# Patient Record
Sex: Female | Born: 1990 | Race: Black or African American | Hispanic: No | Smoking: Current every day smoker
Health system: Southern US, Community
[De-identification: ages and names within clinical notes are randomized; demographics above are authoritative.]

## PROBLEM LIST (undated history)

## (undated) DIAGNOSIS — Z789 Other specified health status: Secondary | ICD-10-CM

## (undated) HISTORY — PX: NO PAST SURGERIES: SHX2092

## (undated) HISTORY — DX: Other specified health status: Z78.9

## (undated) HISTORY — PX: DENTAL SURGERY: SHX609

## (undated) HISTORY — PX: WISDOM TOOTH EXTRACTION: SHX21

---

## 2012-06-04 ENCOUNTER — Emergency Department (HOSPITAL_COMMUNITY)
Admission: EM | Admit: 2012-06-04 | Discharge: 2012-06-05 | Disposition: A | Discharge status: Home / Self Care | Payer: Self-pay | Attending: Emergency Medicine | Admitting: Emergency Medicine

## 2012-06-04 ENCOUNTER — Encounter (HOSPITAL_COMMUNITY): Payer: Self-pay | Admitting: *Deleted

## 2012-06-04 DIAGNOSIS — J069 Acute upper respiratory infection, unspecified: Secondary | ICD-10-CM | POA: Insufficient documentation

## 2012-06-04 DIAGNOSIS — J029 Acute pharyngitis, unspecified: Secondary | ICD-10-CM | POA: Insufficient documentation

## 2012-06-04 MED ORDER — SODIUM CHLORIDE 0.9 % IV BOLUS (SEPSIS)
1000.0000 mL | Freq: Once | INTRAVENOUS | Status: AC
  Administered 2012-06-05: 1000 mL via INTRAVENOUS

## 2012-06-04 MED ORDER — ONDANSETRON HCL 4 MG/2ML IJ SOLN
4.0000 mg | Freq: Once | INTRAMUSCULAR | Status: AC
  Administered 2012-06-05: 4 mg via INTRAVENOUS
  Filled 2012-06-04: qty 2

## 2012-06-04 MED ORDER — ACETAMINOPHEN 325 MG PO TABS
650.0000 mg | ORAL_TABLET | Freq: Once | ORAL | Status: AC
  Administered 2012-06-04: 650 mg via ORAL
  Filled 2012-06-04: qty 2

## 2012-06-04 NOTE — ED Notes (Signed)
Pt c/o body aches and chills.  Mother states she recently had the flu and thinks she passed it to her.  Also c/o abdominal pain with n/v/d.

## 2012-06-05 LAB — POCT I-STAT, CHEM 8
Calcium, Ion: 1.14 mmol/L (ref 1.12–1.23)
Creatinine, Ser: 1 mg/dL (ref 0.50–1.10)
Glucose, Bld: 94 mg/dL (ref 70–99)
Hemoglobin: 14.3 g/dL (ref 12.0–15.0)
Sodium: 137 mEq/L (ref 135–145)
TCO2: 22 mmol/L (ref 0–100)

## 2012-06-05 LAB — URINALYSIS, ROUTINE W REFLEX MICROSCOPIC
Ketones, ur: 40 mg/dL — AB
Nitrite: NEGATIVE
Specific Gravity, Urine: 1.043 — ABNORMAL HIGH (ref 1.005–1.030)
Urobilinogen, UA: 1 mg/dL (ref 0.0–1.0)

## 2012-06-05 LAB — URINE MICROSCOPIC-ADD ON

## 2012-06-05 MED ORDER — IBUPROFEN 800 MG PO TABS: 800.0000 mg | ORAL_TABLET | Freq: Three times a day (TID) | ORAL | Status: AC

## 2012-06-05 MED ORDER — ACETAMINOPHEN-CODEINE 120-12 MG/5ML PO SUSP: 5.0000 mL | Freq: Four times a day (QID) | ORAL | Status: AC | PRN

## 2012-06-05 MED ORDER — PROMETHAZINE HCL 25 MG PO TABS: 25.0000 mg | ORAL_TABLET | Freq: Four times a day (QID) | ORAL | Status: DC | PRN

## 2012-06-05 NOTE — ED Notes (Signed)
Pt's family at bedside reports pt has been experiencing body aches, nausea and sore throat for the past few days - pt not speaking d/t pain in her throat. Pt in no acute distress, skin warm and dry. Pt w/ productive cough as well w/ yellow phlegm.

## 2012-06-05 NOTE — ED Notes (Signed)
Pt self-ambulated to restroom.

## 2012-06-05 NOTE — ED Notes (Signed)
Rx given x3 Pt ambulating independently w/ steady gait on d/c in no acute distress, A&Ox4. D/c instructions reviewed w/ pt and family - pt and family deny any further questions or concerns at present.  

## 2012-06-05 NOTE — ED Provider Notes (Signed)
History     CSN: [id number]  Arrival date & time 06/04/12  2119   First MD Initiated Contact with Patient 06/04/12 2316      Chief Complaint  Patient presents with  . URI  . Abdominal Pain    (Consider location/radiation/quality/duration/timing/severity/associated sxs/prior treatment) HPI HX per PT. Cough, congestion and sore throat last few days, tonight developed fever and emesis x 1, has had loose stool last few days, no blood in stools or emesis. Mild epigastric pain today prior to emesis, no pain since that time. Mother bedside had similar symptoms last week.  No HA, rash, tick exposure or neck stiffness.  History reviewed. No pertinent past medical history.  History reviewed. No pertinent past surgical history.  History reviewed. No pertinent family history.  History  Substance Use Topics  . Smoking status: Never Smoker   . Smokeless tobacco: Not on file  . Alcohol Use:     OB History    Grav Para Term Preterm Abortions TAB SAB Ect Mult Living                  Review of Systems  Constitutional: Positive for fever and chills.  HENT: Positive for sore throat. Negative for trouble swallowing, neck pain and neck stiffness.   Eyes: Negative for pain.  Respiratory: Positive for cough.   Cardiovascular: Negative for chest pain.  Gastrointestinal: Negative for blood in stool and abdominal distention.  Genitourinary: Negative for dysuria.  Musculoskeletal: Negative for back pain.  Skin: Negative for rash.  Neurological: Negative for syncope.  All other systems reviewed and are negative.    Allergies  Review of patient's allergies indicates no known allergies.  Home Medications  No current outpatient prescriptions on file.  BP 116/62  Pulse 104  Temp 100.2 F (37.9 C) (Oral)  Resp 16  SpO2 100%  Physical Exam  Constitutional: She is oriented to person, place, and time. She appears well-developed and well-nourished.  HENT:  Head: Normocephalic and  atraumatic.       enlarged erythematous tonsils, uvula midline. Nasal congestion  Eyes: Conjunctivae and EOM are normal. Pupils are equal, round, and reactive to light.  Neck: Trachea normal. Neck supple.  Cardiovascular: S1 normal, S2 normal and normal pulses.     No systolic murmur is present   No diastolic murmur is present  Pulses:      Radial pulses are 2+ on the right side, and 2+ on the left side.       Mild tachycardia  Pulmonary/Chest: Effort normal and breath sounds normal. She has no wheezes. She has no rhonchi. She has no rales. She exhibits no tenderness.  Abdominal: Soft. Normal appearance and bowel sounds are normal. There is no tenderness. There is no CVA tenderness and negative Murphy's sign.  Musculoskeletal:       BLE:s Calves nontender, no cords or erythema, negative Homans sign  Neurological: She is alert and oriented to person, place, and time. She has normal strength. No cranial nerve deficit or sensory deficit. GCS eye subscore is 4. GCS verbal subscore is 5. GCS motor subscore is 6.  Skin: Skin is warm and dry. No rash noted. She is not diaphoretic.  Psychiatric: Her speech is normal.       Cooperative and appropriate    ED Course  Procedures (including critical care time)  Results for orders placed during the hospital encounter of 06/04/12  RAPID STREP SCREEN      Component Value Range   Streptococcus,  Group A Screen (Direct) NEGATIVE  NEGATIVE  POCT I-STAT, CHEM 8      Component Value Range   Sodium 137  135 - 145 mEq/L   Potassium 3.7  3.5 - 5.1 mEq/L   Chloride 103  96 - 112 mEq/L   BUN 11  6 - 23 mg/dL   Creatinine, Ser 1.61  0.50 - 1.10 mg/dL   Glucose, Bld 94  70 - 99 mg/dL   Calcium, Ion 0.96  0.45 - 1.23 mmol/L   TCO2 22  0 - 100 mmol/L   Hemoglobin 14.3  12.0 - 15.0 g/dL   HCT 40.9  81.1 - 91.4 %  POCT PREGNANCY, URINE      Component Value Range   Preg Test, Ur NEGATIVE  NEGATIVE   IVFs, zofran and tylenol.   Clinical URI/ pharyngitis -  strep neg. Electrolytes WNL.   1:06 AM on recheck feeling better, no N/V in ED.    Plan d/c home with URI precautions verbalized as understood. Written precautions provided and referral provided for follow up.  MDM   VS and nursing notes reviewed - no ABD tenderness or peritonitis serial exams. Labs, UPT, rapid strep as above. Improved with tylenol and IVFs,        Sunnie Nielsen, MD 06/05/12 0110

## 2015-05-11 ENCOUNTER — Emergency Department (HOSPITAL_COMMUNITY): Payer: Self-pay

## 2015-05-11 ENCOUNTER — Encounter (HOSPITAL_COMMUNITY): Payer: Self-pay | Admitting: Emergency Medicine

## 2015-05-11 ENCOUNTER — Emergency Department (HOSPITAL_COMMUNITY)
Admission: EM | Admit: 2015-05-11 | Discharge: 2015-05-11 | Disposition: A | Discharge status: Home / Self Care | Payer: Self-pay | Attending: Emergency Medicine | Admitting: Emergency Medicine

## 2015-05-11 DIAGNOSIS — Z3202 Encounter for pregnancy test, result negative: Secondary | ICD-10-CM | POA: Insufficient documentation

## 2015-05-11 DIAGNOSIS — W231XXA Caught, crushed, jammed, or pinched between stationary objects, initial encounter: Secondary | ICD-10-CM | POA: Insufficient documentation

## 2015-05-11 DIAGNOSIS — S6722XA Crushing injury of left hand, initial encounter: Secondary | ICD-10-CM

## 2015-05-11 DIAGNOSIS — Y939 Activity, unspecified: Secondary | ICD-10-CM | POA: Insufficient documentation

## 2015-05-11 DIAGNOSIS — Z720 Tobacco use: Secondary | ICD-10-CM | POA: Insufficient documentation

## 2015-05-11 DIAGNOSIS — S60122A Contusion of left index finger with damage to nail, initial encounter: Secondary | ICD-10-CM | POA: Insufficient documentation

## 2015-05-11 DIAGNOSIS — Y929 Unspecified place or not applicable: Secondary | ICD-10-CM | POA: Insufficient documentation

## 2015-05-11 DIAGNOSIS — Y999 Unspecified external cause status: Secondary | ICD-10-CM | POA: Insufficient documentation

## 2015-05-11 LAB — POC URINE PREG, ED: PREG TEST UR: NEGATIVE

## 2015-05-11 MED ORDER — IBUPROFEN 600 MG PO TABS: 600.0000 mg | ORAL_TABLET | Freq: Four times a day (QID) | ORAL | Status: DC | PRN

## 2015-05-11 MED ORDER — IBUPROFEN 800 MG PO TABS: 800.0000 mg | ORAL_TABLET | Freq: Once | ORAL | Status: DC

## 2015-05-11 NOTE — ED Notes (Signed)
Pt stated that the door of her car closed onto the tip of her index finger on l/hand. Bleeding controlled. Bandaid in place

## 2015-05-11 NOTE — Discharge Instructions (Signed)
Keep elevated. Soak in warm soapy water. Keep clean. Ibuprofen for pain. Follow up with primary care doctor as needed.    Crush Injury, Fingers or Toes A crush injury to the fingers or toes means the tissues have been damaged by being squeezed (compressed). There will be bleeding into the tissues and swelling. Often, blood will collect under the skin. When this happens, the skin on the finger often dies and may slough off (shed) 1 week to 10 days later. Usually, new skin is growing underneath. If the injury has been too severe and the tissue does not survive, the damaged tissue may begin to turn black over several days.  Wounds which occur because of the crushing may be stitched (sutured) shut. However, crush injuries are more likely to become infected than other injuries.These wounds may not be closed as tightly as other types of cuts to prevent infection. Nails involved are often lost. These usually grow back over several weeks.  DIAGNOSIS X-rays may be taken to see if there is any injury to the bones. TREATMENT Broken bones (fractures) may be treated with splinting, depending on the fracture. Often, no treatment is required for fractures of the last bone in the fingers or toes. HOME CARE INSTRUCTIONS   The crushed part should be raised (elevated) above the heart or center of the chest as much as possible for the first several days or as directed. This helps with pain and lessens swelling. Less swelling increases the chances that the crushed part will survive.  Put ice on the injured area.  Put ice in a plastic bag.  Place a towel between your skin and the bag.  Leave the ice on for 15-20 minutes, 03-04 times a day for the first 2 days.  Only take over-the-counter or prescription medicines for pain, discomfort, or fever as directed by your caregiver.  Use your injured part only as directed.  Change your bandages (dressings) as directed.  Keep all follow-up appointments as directed by  your caregiver. Not keeping your appointment could result in a chronic or permanent injury, pain, and disability. If there is any problem keeping the appointment, you must call to reschedule. SEEK IMMEDIATE MEDICAL CARE IF:   There is redness, swelling, or increasing pain in the wound area.  Pus is coming from the wound.  You have a fever.  You notice a bad smell coming from the wound or dressing.  The edges of the wound do not stay together after the sutures have been removed.  You are unable to move the injured finger or toe. MAKE SURE YOU:   Understand these instructions.  Will watch your condition.  Will get help right away if you are not doing well or get worse. Document Released: 09/23/2005 Document Revised: 12/16/2011 Document Reviewed: 02/08/2011 [facility] Patient Information 2015 [place], [place]. This information is not intended to replace advice given to you by your health care provider. Make sure you discuss any questions you have with your health care provider.  Subungual Hematoma A subungual hematoma is a pocket of blood that collects under the fingernail or toenail. The pressure created by the blood under the nail can cause pain. CAUSES  A subungual hematoma occurs when an injury to the finger or toe causes a blood vessel beneath the nail to break. The injury can occur from a direct blow such as slamming a finger in a door. It can also occur from a repeated injury such as pressure on the foot in a shoe while  running. A subungual hematoma is sometimes called runner's toe or tennis toe. SYMPTOMS   Blue or dark blue skin under the nail.  Pain or throbbing in the injured area. DIAGNOSIS  Your caregiver can determine whether you have a subungual hematoma based on your history and a physical exam. If your caregiver thinks you might have a broken (fractured) bone, X-rays may be taken. TREATMENT  Hematomas usually go away on their own over time. Your caregiver may make a hole  in the nail to drain the blood. Draining the blood is painless and usually provides significant relief from pain and throbbing. The nail usually grows back normally after this procedure. In some cases, the nail may need to be removed. This is done if there is a cut under the nail that requires stitches (sutures). HOME CARE INSTRUCTIONS   Put ice on the injured area.  Put ice in a plastic bag.  Place a towel between your skin and the bag.  Leave the ice on for 15-20 minutes, 03-04 times a day for the first 1 to 2 days.  Elevate the injured area to help decrease pain and swelling.  If you were given a bandage, wear it for as long as directed by your caregiver.  If part of your nail falls off, trim the remaining nail gently. This prevents the nail from catching on something and causing further injury.  Only take over-the-counter or prescription medicines for pain, discomfort, or fever as directed by your caregiver. SEEK IMMEDIATE MEDICAL CARE IF:   You have redness or swelling around the nail.  You have yellowish-white fluid (pus) coming from the nail.  Your pain is not controlled with medicine.  You have a fever. MAKE SURE YOU:  Understand these instructions.  Will watch your condition.  Will get help right away if you are not doing well or get worse. Document Released: 09/20/2000 Document Revised: 12/16/2011 Document Reviewed: 09/11/2011 [facility] Patient Information 2015 [place], [place]. This information is not intended to replace advice given to you by your health care provider. Make sure you discuss any questions you have with your health care provider.

## 2015-05-11 NOTE — ED Provider Notes (Signed)
CSN: [id number]     Arrival date & time 05/11/15  1351 History  This chart was scribed for non-physician practitioner, Jaynie Crumble, PA-C, working with Mirian Mo, MD, by Budd Palmer ED Scribe. This patient was seen in room WTR7/WTR7 and the patient's care was started at 2:32 PM     Chief Complaint  Patient presents with  . Finger Injury    crush injury to tip of index finger on l/hand   The history is provided by the patient. No language interpreter was used.   HPI Comments: Valerie Velasquez is a 24 y.o. female who presents to the Emergency Department complaining of a painful injury to the tip of the left index finger sustained when the door of her car closed on it. She reports associated swelling, bruising, and bleeding and has applied a band-aid for the bleeding.  History reviewed. No pertinent past medical history. Past Surgical History  Procedure Laterality Date  . Dental surgery     History reviewed. No pertinent family history. History  Substance Use Topics  . Smoking status: Current Every Day Smoker    Types: Cigarettes  . Smokeless tobacco: Not on file  . Alcohol Use: Yes     Comment: occasional   OB History    No data available     Review of Systems  Musculoskeletal: Positive for myalgias.  Skin: Positive for color change and wound.    Allergies  Review of patient's allergies indicates no known allergies.  Home Medications   Prior to Admission medications   Medication Sig Start Date End Date Taking? Authorizing Provider  promethazine (PHENERGAN) 25 MG tablet Take 1 tablet (25 mg total) by mouth every 6 (six) hours as needed for nausea. 06/05/12 06/12/12  Sunnie Nielsen, MD   BP 122/86 mmHg  Pulse 70  Temp(Src) 98.7 F (37.1 C) (Oral)  Resp 18  Wt 217 lb (98.431 kg)  SpO2 99%  LMP 03/11/2015 (Approximate) Physical Exam  Constitutional: She is oriented to person, place, and time. She appears well-developed and well-nourished. No distress.  HENT:   Head: Normocephalic and atraumatic.  Mouth/Throat: Oropharynx is clear and moist.  Eyes: Conjunctivae and EOM are normal. Pupils are equal, round, and reactive to light.  Neck: Normal range of motion. Neck supple. No tracheal deviation present.  Cardiovascular: Normal rate.   Pulmonary/Chest: Breath sounds normal. No respiratory distress.  Abdominal: Soft.  Musculoskeletal: Normal range of motion.  Swelling to the distal right distal index finger. Subungual hematoma noted. No lacerations. ttp to the distal phalanx. Pain with rom at DIP joint. Cap refill <2sec  Neurological: She is alert and oriented to person, place, and time.  Skin: Skin is warm and dry.  Psychiatric: She has a normal mood and affect. Her behavior is normal.  Nursing note and vitals reviewed.   ED Course  Procedures  DIAGNOSTIC STUDIES: Oxygen Saturation is 99% on RA, normal by my interpretation.    COORDINATION OF CARE: 2:40 PM - Discussed preliminary normal XR results. Used a needle to drain the blood under the fingernail. Advised pt to wait for XR interpretation. Pt advised of plan for treatment and pt agrees.  Labs Review Labs Reviewed  POC URINE PREG, ED    Imaging Review Dg Finger Index Left  05/11/2015   CLINICAL DATA:  Crush injury to the tip of the index finger in car door. Initial encounter.  EXAM: LEFT INDEX FINGER 2+V  COMPARISON:  None.  FINDINGS: Soft tissue gas dorsal to the second  distal phalanx. No underlying fracture or dislocation.  IMPRESSION: Soft tissue gas without fracture.   Electronically Signed   By: Marnee Spring M.D.   On: 05/11/2015 14:32     EKG Interpretation None      MDM   Final diagnoses:  Crushing injury of finger of left hand, initial encounter  Subungual hematoma of second finger of left hand, initial encounter   Patient with crush injury to the distal right index finger. No lacerations. Subungual hematoma noted. Drained with 18-gauge needle. Pain improved. X-rays  negative. Instructed to keep elevated, soak, ibuprofen for pain, follow-up as needed.  Filed Vitals:   05/11/15 1401 05/11/15 1506  BP: 122/86   Pulse: 70 68  Temp: 98.7 F (37.1 C)   TempSrc: Oral   Resp: 18   Weight: 217 lb (98.431 kg)   SpO2: 99% 98%   I personally performed the services described in this documentation, which was scribed in my presence. The recorded information has been reviewed and is accurate.   Jaynie Crumble, PA-C 05/11/15 1642  Mirian Mo, MD 05/12/15 1046

## 2018-09-11 ENCOUNTER — Encounter (HOSPITAL_COMMUNITY): Payer: Self-pay

## 2018-09-11 ENCOUNTER — Emergency Department (HOSPITAL_COMMUNITY)
Admission: EM | Admit: 2018-09-11 | Discharge: 2018-09-11 | Disposition: A | Discharge status: Home / Self Care | Payer: Medicaid Other | Attending: Emergency Medicine | Admitting: Emergency Medicine

## 2018-09-11 ENCOUNTER — Other Ambulatory Visit: Payer: Self-pay

## 2018-09-11 DIAGNOSIS — Z3201 Encounter for pregnancy test, result positive: Secondary | ICD-10-CM | POA: Insufficient documentation

## 2018-09-11 DIAGNOSIS — F17210 Nicotine dependence, cigarettes, uncomplicated: Secondary | ICD-10-CM | POA: Insufficient documentation

## 2018-09-11 DIAGNOSIS — Z3490 Encounter for supervision of normal pregnancy, unspecified, unspecified trimester: Secondary | ICD-10-CM

## 2018-09-11 DIAGNOSIS — Z79899 Other long term (current) drug therapy: Secondary | ICD-10-CM | POA: Insufficient documentation

## 2018-09-11 DIAGNOSIS — M549 Dorsalgia, unspecified: Secondary | ICD-10-CM | POA: Insufficient documentation

## 2018-09-11 LAB — URINALYSIS, ROUTINE W REFLEX MICROSCOPIC
Bilirubin Urine: NEGATIVE
GLUCOSE, UA: NEGATIVE mg/dL
HGB URINE DIPSTICK: NEGATIVE
Ketones, ur: NEGATIVE mg/dL
Leukocytes, UA: NEGATIVE
Nitrite: NEGATIVE
Protein, ur: NEGATIVE mg/dL
SPECIFIC GRAVITY, URINE: 1.026 (ref 1.005–1.030)
pH: 7 (ref 5.0–8.0)

## 2018-09-11 LAB — POC URINE PREG, ED: Preg Test, Ur: POSITIVE — AB

## 2018-09-11 MED ORDER — PRENATAL COMPLETE 14-0.4 MG PO TABS: 1.0000 | ORAL_TABLET | Freq: Every day | ORAL | 0 refills | Status: AC

## 2018-09-11 NOTE — ED Triage Notes (Signed)
Pt states that she has been having lower back pain x2 weeks. Pt had a fall on her tailbone as well as few days ago. Pt has been taking ibuprofen without relief. Denies any bowel or bladder issues. Pt states back pain is worse on the left side. Pt is unsure of pregnancy status.

## 2018-09-11 NOTE — Discharge Instructions (Addendum)
Take Tylenol as needed for back pain.  Ice affected area.  Follow-up with OB/GYN as soon as possible for continued evaluation.  Take a daily prenatal vitamin.  Return to the ED immediately for new or worsening symptoms, such as abdominal pain, vaginal bleeding or any concerns at all.

## 2018-09-11 NOTE — ED Provider Notes (Signed)
[facility]-EMERGENCY DEPT Provider Note   CSN: [id number] Arrival date & time: 09/11/18  1542     History   Chief Complaint Chief Complaint  Patient presents with  . Back Pain    HPI Valerie Velasquez is a 27 y.o. female.  HPI 27 year old female presents with intermittent back pain 2 weeks.  She describes it as low back pain worse with certain movements.  She states the pain improves when she is up and walking around.  She denies any radiation of the pain down the legs or to the abdomen.  She denies any nausea, vomiting, abdominal pain, dysuria, urinary urgency, urinary frequency.  She denies any vaginal bleeding, vaginal discharge.  She states her last period was in October.  She has not taken a pregnancy test.   History reviewed. No pertinent past medical history.  There are no active problems to display for this patient.   Past Surgical History:  Procedure Laterality Date  . DENTAL SURGERY       OB History   None      Home Medications    Prior to Admission medications   Medication Sig Start Date End Date Taking? Authorizing Provider  ibuprofen (ADVIL,MOTRIN) 600 MG tablet Take 1 tablet (600 mg total) by mouth every 6 (six) hours as needed. 05/11/15   Kirichenko, Lemont Fillers, PA-C  promethazine (PHENERGAN) 25 MG tablet Take 1 tablet (25 mg total) by mouth every 6 (six) hours as needed for nausea. 06/05/12 06/12/12  Sunnie Nielsen, MD    Family History No family history on file.  Social History Social History   Tobacco Use  . Smoking status: Current Every Day Smoker    Packs/day: 0.50    Types: Cigarettes  Substance Use Topics  . Alcohol use: Yes    Comment: occasional  . Drug use: Yes    Types: Marijuana     Allergies   Patient has no known allergies.   Review of Systems Review of Systems  Constitutional: Negative for chills and fever.  Respiratory: Negative for shortness of breath.   Cardiovascular: Negative for chest pain.    Gastrointestinal: Negative for abdominal pain, nausea and vomiting.  Genitourinary: Negative for dysuria, urgency, vaginal bleeding and vaginal discharge.  Musculoskeletal: Positive for back pain (intermittent).     Physical Exam Updated Vital Signs BP 124/77 (BP Location: Right Arm)   Pulse 93   Temp 99.5 F (37.5 C) (Oral)   Resp 18   Ht 4\' 11"  (1.499 m)   Wt 122.5 kg   SpO2 98%   BMI 54.53 kg/m   Physical Exam  Constitutional: She is oriented to person, place, and time. She appears well-developed and well-nourished.  HENT:  Head: Normocephalic and atraumatic.  Eyes: Conjunctivae and EOM are normal.  Neck: Neck supple.  Cardiovascular: Normal rate, regular rhythm and normal heart sounds.  No murmur heard. Pulmonary/Chest: Effort normal and breath sounds normal. No respiratory distress. She has no wheezes. She has no rales.  Abdominal: Soft. Bowel sounds are normal. She exhibits no distension. There is no tenderness.  Musculoskeletal: Normal range of motion. She exhibits no tenderness or deformity.       Cervical back: Normal.       Thoracic back: Normal.       Lumbar back: Normal.  Neurological: She is alert and oriented to person, place, and time.  Skin: Skin is warm and dry. No rash noted. No erythema.  Psychiatric: She has a normal mood and affect. Her  behavior is normal.  Nursing note and vitals reviewed.    ED Treatments / Results  Labs (all labs ordered are listed, but only abnormal results are displayed) Labs Reviewed  POC URINE PREG, ED - Abnormal; Notable for the following components:      Result Value   Preg Test, Ur POSITIVE (*)    All other components within normal limits  URINALYSIS, ROUTINE W REFLEX MICROSCOPIC    EKG None  Radiology No results found.  Procedures Procedures (including critical care time)  Medications Ordered in ED Medications - No data to display   Initial Impression / Assessment and Plan / ED Course  I have reviewed  the triage vital signs and the nursing notes.  Pertinent labs & imaging results that were available during my care of the patient were reviewed by me and considered in my medical decision making (see chart for details).     Resting comfortably in bed, no acute distress, nontoxic, non-lethargic.  Signs stable.  Patient denies back pain currently.  Back is nontender to palpation.  Her urine pregnancy is positive.  Patient denies any abdominal pain, vaginal bleeding, vaginal discharge.  Urine shows no bacteria in the urine.  No indication for further work-up at this time.  Encouraged outpatient follow-up for ultrasound.  Given strict return precautions.   At this time there does not appear to be any evidence of an acute emergency medical condition and the patient appears stable for discharge with appropriate outpatient follow up.Diagnosis was discussed with patient who verbalizes understanding and is agreeable to discharge.   Final Clinical Impressions(s) / ED Diagnoses   Final diagnoses:  None    ED Discharge Orders    None       Rueben BashKendrick, Symone Cornman S, PA-C 09/11/18 2227    Loren RacerYelverton, David, MD 09/11/18 2334

## 2018-09-27 ENCOUNTER — Emergency Department (HOSPITAL_COMMUNITY)
Admission: EM | Admit: 2018-09-27 | Discharge: 2018-09-27 | Disposition: A | Discharge status: Home / Self Care | Payer: Medicaid Other | Attending: Emergency Medicine | Admitting: Emergency Medicine

## 2018-09-27 ENCOUNTER — Encounter (HOSPITAL_COMMUNITY): Payer: Self-pay | Admitting: Emergency Medicine

## 2018-09-27 DIAGNOSIS — Z3A08 8 weeks gestation of pregnancy: Secondary | ICD-10-CM | POA: Insufficient documentation

## 2018-09-27 DIAGNOSIS — O99331 Smoking (tobacco) complicating pregnancy, first trimester: Secondary | ICD-10-CM | POA: Diagnosis not present

## 2018-09-27 DIAGNOSIS — R110 Nausea: Secondary | ICD-10-CM | POA: Diagnosis not present

## 2018-09-27 DIAGNOSIS — Z3491 Encounter for supervision of normal pregnancy, unspecified, first trimester: Secondary | ICD-10-CM

## 2018-09-27 DIAGNOSIS — F17210 Nicotine dependence, cigarettes, uncomplicated: Secondary | ICD-10-CM | POA: Insufficient documentation

## 2018-09-27 DIAGNOSIS — O219 Vomiting of pregnancy, unspecified: Secondary | ICD-10-CM | POA: Insufficient documentation

## 2018-09-27 DIAGNOSIS — O210 Mild hyperemesis gravidarum: Secondary | ICD-10-CM | POA: Diagnosis not present

## 2018-09-27 LAB — BASIC METABOLIC PANEL
Anion gap: 10 (ref 5–15)
BUN: 8 mg/dL (ref 6–20)
CALCIUM: 8.8 mg/dL — AB (ref 8.9–10.3)
CO2: 24 mmol/L (ref 22–32)
Chloride: 101 mmol/L (ref 98–111)
Creatinine, Ser: 0.78 mg/dL (ref 0.44–1.00)
GFR calc Af Amer: >60 mL/min (ref >60)
GLUCOSE: 102 mg/dL — AB (ref 70–99)
Potassium: 4.3 mmol/L (ref 3.5–5.1)
Sodium: 135 mmol/L (ref 135–145)

## 2018-09-27 LAB — URINALYSIS, ROUTINE W REFLEX MICROSCOPIC
BACTERIA UA: NONE SEEN
BILIRUBIN URINE: NEGATIVE
Glucose, UA: NEGATIVE mg/dL
Hgb urine dipstick: NEGATIVE
Ketones, ur: 5 mg/dL — AB
Leukocytes, UA: NEGATIVE
Nitrite: NEGATIVE
PROTEIN: NEGATIVE mg/dL
SPECIFIC GRAVITY, URINE: 1.005 (ref 1.005–1.030)
pH: 8 (ref 5.0–8.0)

## 2018-09-27 LAB — CBC WITH DIFFERENTIAL/PLATELET
Abs Immature Granulocytes: 0.05 10*3/uL (ref 0.00–0.07)
BASOS PCT: 1 %
Basophils Absolute: 0.1 10*3/uL (ref 0.0–0.1)
EOS ABS: 0.1 10*3/uL (ref 0.0–0.5)
Eosinophils Relative: 1 %
HCT: 36 % (ref 36.0–46.0)
Hemoglobin: 11.9 g/dL — ABNORMAL LOW (ref 12.0–15.0)
IMMATURE GRANULOCYTES: 1 %
Lymphocytes Relative: 17 %
Lymphs Abs: 1.8 10*3/uL (ref 0.7–4.0)
MCH: 30 pg (ref 26.0–34.0)
MCHC: 33.1 g/dL (ref 30.0–36.0)
MCV: 90.7 fL (ref 80.0–100.0)
MONO ABS: 0.9 10*3/uL (ref 0.1–1.0)
Monocytes Relative: 9 %
NEUTROS PCT: 71 %
Neutro Abs: 7.8 10*3/uL — ABNORMAL HIGH (ref 1.7–7.7)
PLATELETS: 353 10*3/uL (ref 150–400)
RBC: 3.97 MIL/uL (ref 3.87–5.11)
RDW: 13.1 % (ref 11.5–15.5)
WBC: 10.8 10*3/uL — ABNORMAL HIGH (ref 4.0–10.5)
nRBC: 0 % (ref 0.0–0.2)

## 2018-09-27 LAB — I-STAT BETA HCG BLOOD, ED (MC, WL, AP ONLY): I-stat hCG, quantitative: >2000 m[IU]/mL — ABNORMAL HIGH (ref <5)

## 2018-09-27 MED ORDER — LACTATED RINGERS IV BOLUS
1000.0000 mL | Freq: Once | INTRAVENOUS | Status: AC
  Administered 2018-09-27: 1000 mL via INTRAVENOUS

## 2018-09-27 MED ORDER — LACTATED RINGERS IV BOLUS
1000.0000 mL | Freq: Once | INTRAVENOUS | Status: AC
Start: 2018-09-27 — End: 2018-09-27
  Administered 2018-09-27: 1000 mL via INTRAVENOUS

## 2018-09-27 MED ORDER — METOCLOPRAMIDE HCL 5 MG/ML IJ SOLN
10.0000 mg | Freq: Once | INTRAMUSCULAR | Status: AC
  Administered 2018-09-27: 10 mg via INTRAVENOUS
  Filled 2018-09-27: qty 2

## 2018-09-27 MED ORDER — DIPHENHYDRAMINE HCL 50 MG/ML IJ SOLN
25.0000 mg | Freq: Once | INTRAMUSCULAR | Status: AC
  Administered 2018-09-27: 25 mg via INTRAVENOUS
  Filled 2018-09-27: qty 1

## 2018-09-27 MED ORDER — DOXYLAMINE-PYRIDOXINE 10-10 MG PO TBEC: 1.0000 | DELAYED_RELEASE_TABLET | Freq: Three times a day (TID) | ORAL | 0 refills | Status: DC | PRN

## 2018-09-27 NOTE — ED Provider Notes (Signed)
[facility]-EMERGENCY DEPT Provider Note   CSN: [id number] Arrival date & time: 09/27/18  [id number]     History   Chief Complaint Chief Complaint  Patient presents with  . Emesis    HPI Valerie Velasquez is a 27 y.o. female.  HPI   27 yo G1P0 at estimated [redacted] wk GA here with n/v. Pt states that for the past 3 weeks, she's had progressively worsening nausea and vomiting. It has been worse in AM but persists throughout the day. Over the last week, she's had difficulty keeping food/drink down and subsequently presents for evaluation. She adamantly denies any abd pain, vaginal bleeding, leakage of fluid, or other complaints. No fever or chills. No urinary sx. She tried OTC nausea meds yesterday but vomited it up quickly. No h/o prior pregnancies. No h/o abdominal surgeries. Sx worse with eating, also in AM as mentioned. No alleviating factors.  History reviewed. No pertinent past medical history.  There are no active problems to display for this patient.   Past Surgical History:  Procedure Laterality Date  . DENTAL SURGERY       OB History    Gravida  1   Para      Term      Preterm      AB      Living        SAB      TAB      Ectopic      Multiple      Live Births               Home Medications    Prior to Admission medications   Medication Sig Start Date End Date Taking? Authorizing Provider  Prenatal Vit-Fe Fumarate-FA (PRENATAL COMPLETE) 14-0.4 MG TABS Take 1 tablet by mouth daily. 09/11/18  Yes Kendrick, Caitlyn S, PA-C  Doxylamine-Pyridoxine 10-10 MG TBEC Take 1 tablet by mouth 3 (three) times daily as needed (nausea, vomiting). 09/27/18   Shaune PollackIsaacs, Misbah Hornaday, MD    Family History No family history on file.  Social History Social History   Tobacco Use  . Smoking status: Current Every Day Smoker    Packs/day: 0.50    Types: Cigarettes  . Smokeless tobacco: Never Used  Substance Use Topics  . Alcohol use: Yes    Comment:  occasional  . Drug use: Yes    Types: Marijuana     Allergies   Patient has no known allergies.   Review of Systems Review of Systems  Constitutional: Positive for fatigue. Negative for chills and fever.  HENT: Negative for congestion, rhinorrhea and sore throat.   Eyes: Negative for visual disturbance.  Respiratory: Negative for cough, shortness of breath and wheezing.   Cardiovascular: Negative for chest pain and leg swelling.  Gastrointestinal: Positive for nausea and vomiting. Negative for abdominal pain and diarrhea.  Genitourinary: Negative for dysuria, flank pain, vaginal bleeding and vaginal discharge.  Musculoskeletal: Negative for neck pain.  Skin: Negative for rash.  Allergic/Immunologic: Negative for immunocompromised state.  Neurological: Negative for syncope and headaches.  Hematological: Does not bruise/bleed easily.  All other systems reviewed and are negative.    Physical Exam Updated Vital Signs BP 135/82 (BP Location: Right Arm)   Pulse 87   Temp 99.3 F (37.4 C) (Oral)   Resp 16   LMP 07/31/2018 (Approximate)   SpO2 100%   Physical Exam Vitals signs and nursing note reviewed.  Constitutional:      General: She is not in acute  distress.    Appearance: She is well-developed.  HENT:     Head: Normocephalic and atraumatic.     Mouth/Throat:     Mouth: Mucous membranes are dry.  Eyes:     Conjunctiva/sclera: Conjunctivae normal.  Neck:     Musculoskeletal: Neck supple.  Cardiovascular:     Rate and Rhythm: Normal rate and regular rhythm.     Heart sounds: Normal heart sounds. No murmur. No friction rub.  Pulmonary:     Effort: Pulmonary effort is normal. No respiratory distress.     Breath sounds: Normal breath sounds. No wheezing or rales.  Abdominal:     General: There is no distension.     Palpations: Abdomen is soft.     Tenderness: There is no abdominal tenderness.     Comments: Non-tender, no distension  Skin:    General: Skin is  warm.     Capillary Refill: Capillary refill takes less than 2 seconds.  Neurological:     Mental Status: She is alert and oriented to person, place, and time.     Motor: No abnormal muscle tone.      ED Treatments / Results  Labs (all labs ordered are listed, but only abnormal results are displayed) Labs Reviewed  CBC WITH DIFFERENTIAL/PLATELET - Abnormal; Notable for the following components:      Result Value   WBC 10.8 (*)    Hemoglobin 11.9 (*)    Neutro Abs 7.8 (*)    All other components within normal limits  BASIC METABOLIC PANEL - Abnormal; Notable for the following components:   Glucose, Bld 102 (*)    Calcium 8.8 (*)    All other components within normal limits  URINALYSIS, ROUTINE W REFLEX MICROSCOPIC - Abnormal; Notable for the following components:   Ketones, ur 5 (*)    All other components within normal limits  I-STAT BETA HCG BLOOD, ED ([facility], [facility], [facility] ONLY) - Abnormal; Notable for the following components:   I-stat hCG, quantitative >2,000.0 (*)    All other components within normal limits    EKG None  Radiology No results found.  Procedures Procedures (including critical care time)  Medications Ordered in ED Medications  lactated ringers bolus 1,000 mL (1,000 mLs Intravenous New Bag/Given 09/27/18 0914)  lactated ringers bolus 1,000 mL (0 mLs Intravenous Stopped 09/27/18 1013)  metoCLOPramide (REGLAN) injection 10 mg (10 mg Intravenous Given 09/27/18 0913)  diphenhydrAMINE (BENADRYL) injection 25 mg (25 mg Intravenous Given 09/27/18 0913)     Initial Impression / Assessment and Plan / ED Course  I have reviewed the triage vital signs and the nursing notes.  Pertinent labs & imaging results that were available during my care of the patient were reviewed by me and considered in my medical decision making (see chart for details).  Clinical Course as of Sep 28 1055  Sun Sep 27, 2018  0904 27 yo G1P0 here w/ nausea vomiting. Likely 2/2 hyperemesis  gravidarum. She is adamant she has no abdominal pain, VB, LOF, or sx to suggest threatened AB or ectopic. Will start fluids, check basic labs, and re-assess.   [CI]  0958 Mild leukocytosis and anemia likely 2/2 her pregnancy, possible mild stress response from vomiting. No fever or infectious sx. No pelvic pain.   [CI]  1025 Lytes largely normal. Pt feeling improved with no vomiting. Plan to f/u UA, d/c if negative. Will start on Diclegis.   [CI]  1056 UA with mild ketonuria but is otherwise unremarkable. Pt  feeling better.   [CI]    Clinical Course User Index [CI] Shaune Pollack, MD      Final Clinical Impressions(s) / ED Diagnoses   Final diagnoses:  First trimester pregnancy  Nausea/vomiting in pregnancy    ED Discharge Orders         Ordered    Doxylamine-Pyridoxine 10-10 MG TBEC  3 times daily PRN     09/27/18 1027           Shaune Pollack, MD 09/27/18 1056

## 2018-09-27 NOTE — ED Notes (Signed)
Urine culture sent down to lab with urinalysis. 

## 2018-09-27 NOTE — Discharge Instructions (Signed)
If the Doxylamine-Pyridoxine tablets are not covered by insurance/Medicaid, I recommend the following:  - Discuss this with the Pharmacist at the pharmacy. There are generic equivalents for this but it's important to verify that the medications are safe in pregnancy. - For the DOXYLAMINE, this is also the generic for Unisom. You can break the 25 mg doxylamine tablets in half to take an appropriate dose (no more than three times daily). - For the PYRIDOXINE, this is Vitamin B6 and can be found at any pharmacy/supermarket. You may need to break the typical tablets up to find an appropriate dose (10 mg).  Follow-up with an OB as scheduled.

## 2018-09-27 NOTE — ED Triage Notes (Signed)
Pt reports that she found out she was pregnant on Dec 6th and has been vomiting since. Reports her first appt ist until Jan.

## 2018-11-06 ENCOUNTER — Ambulatory Visit (INDEPENDENT_AMBULATORY_CARE_PROVIDER_SITE_OTHER): Payer: Medicaid Other | Admitting: Family Medicine

## 2018-11-06 ENCOUNTER — Other Ambulatory Visit (HOSPITAL_COMMUNITY)
Admission: RE | Admit: 2018-11-06 | Discharge: 2018-11-06 | Disposition: A | Discharge status: Home / Self Care | Payer: Medicaid Other | Source: Ambulatory Visit | Attending: Obstetrics and Gynecology | Admitting: Obstetrics and Gynecology

## 2018-11-06 ENCOUNTER — Other Ambulatory Visit: Payer: Self-pay

## 2018-11-06 ENCOUNTER — Ambulatory Visit: Payer: Medicaid Other | Admitting: Clinical

## 2018-11-06 VITALS — BP 116/73 | HR 88 | Ht 60.0 in | Wt 214.2 lb

## 2018-11-06 DIAGNOSIS — Z3400 Encounter for supervision of normal first pregnancy, unspecified trimester: Secondary | ICD-10-CM | POA: Diagnosis not present

## 2018-11-06 NOTE — Progress Notes (Signed)
New Ob intake interview completed and pregnancy information packet given. Labs drawn and office procedures explained. Pt agrees to sign up for MyChart. She was given Flu vaccine information sheet and is considering receiving vaccine @ next visit. Pt states she had significant nausea and vomiting earlier in pregnancy however is much less now. Per chart review, pt has had a large weight loss since 09/11/18. I advise her to discuss with provider @ next visit if vomiting or weight loss continues. She has New Ob provider appt on 11/20/18.

## 2018-11-06 NOTE — BH Specialist Note (Signed)
Integrated Behavioral Health Initial Visit  MRN: [id number] Name: Valerie Velasquez  Number of Integrated Behavioral Health Clinician visits:: 1/6 Session Start time: 11:24 Session End time: 11:35 Total time: 15 minutes   Type of Service: Integrated Behavioral Health- Individual/Family Interpretor:No. Interpretor Name and Language: n/a   Warm Hand Off Completed.       SUBJECTIVE: Sahni Tordoff is a 28 y.o. female accompanied by n/a  Patient was referred by Rhett Bannister, DO  for Initial OB introduction to integrated behavioral health services . Patient reports the following symptoms/concerns: Pt states no particular concern today Duration of problem: n/a; Severity of problem: n/a  OBJECTIVE: Mood: Normal and Affect: Appropriate  Risk of harm to self or others: No plan to harm self or others   LIFE CONTEXT: Family and Social: - School/Work: - Self-Care: - Life Changes: Current pregnancy   GOALS ADDRESSED: Patient will: 1. Increase knowledge and/or ability of: healthy habits  2. Demonstrate ability to: Increase healthy adjustment to current life circumstances  INTERVENTIONS: Interventions utilized: Psychoeducation and/or Health Education  Standardized Assessments completed: GAD-7 and PHQ 9   ASSESSMENT: Patient currently experiencing Supervision of low-risk first pregnancy, antepartum.   Patient may benefit from Initial OB introduction to integrated behavioral health services  .  PLAN: 1. Follow up with behavioral health clinician on : As needed 2. Behavioral recommendations:  -Continue taking prenatal vitamin, as recommended by medical provider 3. Referral(s): Integrated [employer] (In Clinic) 4. "From scale of 1-10, how likely are you to follow plan?": 10  Rae Lips, LCSW  Depression screen [facility] 2/9 11/06/2018  Decreased Interest 1  Down, Depressed, Hopeless 1  PHQ - 2 Score 2  Altered sleeping 2  Tired, decreased energy 1   Change in appetite 3  Feeling bad or failure about yourself  0  Trouble concentrating 0  Moving slowly or fidgety/restless 0  Suicidal thoughts 0  PHQ-9 Score 8   GAD 7 : Generalized Anxiety Score 11/06/2018  Nervous, Anxious, on Edge 1  Control/stop worrying 1  Worry too much - different things 1  Trouble relaxing 1  Restless 0  Easily annoyed or irritable 1  Afraid - awful might happen 3  Total GAD 7 Score 8

## 2018-11-08 LAB — CULTURE, OB URINE

## 2018-11-08 LAB — URINE CULTURE, OB REFLEX

## 2018-11-09 ENCOUNTER — Encounter: Payer: Self-pay | Admitting: *Deleted

## 2018-11-09 LAB — GC/CHLAMYDIA PROBE AMP (~~LOC~~) NOT AT ARMC
Chlamydia: NEGATIVE
NEISSERIA GONORRHEA: NEGATIVE

## 2018-11-17 LAB — OBSTETRIC PANEL, INCLUDING HIV
Antibody Screen: NEGATIVE
Basophils Absolute: 0.1 10*3/uL (ref 0.0–0.2)
Basos: 0 %
EOS (ABSOLUTE): 0.1 10*3/uL (ref 0.0–0.4)
Eos: 1 %
HIV Screen 4th Generation wRfx: NONREACTIVE
Hematocrit: 33.5 % — ABNORMAL LOW (ref 34.0–46.6)
Hemoglobin: 11.6 g/dL (ref 11.1–15.9)
Hepatitis B Surface Ag: NEGATIVE
IMMATURE GRANS (ABS): 0 10*3/uL (ref 0.0–0.1)
Immature Granulocytes: 0 %
Lymphocytes Absolute: 2 10*3/uL (ref 0.7–3.1)
Lymphs: 16 %
MCH: 29.9 pg (ref 26.6–33.0)
MCHC: 34.6 g/dL (ref 31.5–35.7)
MCV: 86 fL (ref 79–97)
MONOS ABS: 0.8 10*3/uL (ref 0.1–0.9)
Monocytes: 6 %
Neutrophils Absolute: 9.9 10*3/uL — ABNORMAL HIGH (ref 1.4–7.0)
Neutrophils: 77 %
Platelets: 411 10*3/uL (ref 150–450)
RBC: 3.88 x10E6/uL (ref 3.77–5.28)
RDW: 13.3 % (ref 11.7–15.4)
RPR Ser Ql: NONREACTIVE
Rh Factor: POSITIVE
Rubella Antibodies, IGG: 1.34 index (ref >0.99)
WBC: 12.9 10*3/uL — ABNORMAL HIGH (ref 3.4–10.8)

## 2018-11-17 LAB — HEMOGLOBINOPATHY EVALUATION
Ferritin: 86 ng/mL (ref 15–150)
HGB A2 QUANT: 2.4 % (ref 1.8–3.2)
Hgb A: 97.6 % (ref 96.4–98.8)
Hgb C: 0 %
Hgb F Quant: 0 % (ref 0.0–2.0)
Hgb S: 0 %
Hgb Solubility: NEGATIVE
Hgb Variant: 0 %

## 2018-11-17 LAB — INHERITEST(R) CF/SMA PANEL

## 2018-11-20 ENCOUNTER — Ambulatory Visit (INDEPENDENT_AMBULATORY_CARE_PROVIDER_SITE_OTHER): Payer: Medicaid Other | Admitting: Family Medicine

## 2018-11-20 ENCOUNTER — Encounter: Payer: Self-pay | Admitting: Family Medicine

## 2018-11-20 ENCOUNTER — Other Ambulatory Visit (HOSPITAL_COMMUNITY)
Admission: RE | Admit: 2018-11-20 | Discharge: 2018-11-20 | Disposition: A | Discharge status: Home / Self Care | Payer: Medicaid Other | Source: Ambulatory Visit | Attending: Family Medicine | Admitting: Family Medicine

## 2018-11-20 DIAGNOSIS — R7303 Prediabetes: Secondary | ICD-10-CM

## 2018-11-20 DIAGNOSIS — Z6841 Body Mass Index (BMI) 40.0 and over, adult: Secondary | ICD-10-CM

## 2018-11-20 DIAGNOSIS — Z3402 Encounter for supervision of normal first pregnancy, second trimester: Secondary | ICD-10-CM

## 2018-11-20 DIAGNOSIS — Z3400 Encounter for supervision of normal first pregnancy, unspecified trimester: Secondary | ICD-10-CM | POA: Diagnosis not present

## 2018-11-20 LAB — HEMOGLOBIN A1C
Est. average glucose Bld gHb Est-mCnc: 123 mg/dL
Hgb A1c MFr Bld: 5.9 % — ABNORMAL HIGH (ref 4.8–5.6)

## 2018-11-20 NOTE — Progress Notes (Signed)
Panorama shipped via [employer]

## 2018-11-20 NOTE — Progress Notes (Signed)
Subjective:   Valerie Velasquez is a 28 y.o. G1P0 at [redacted]w[redacted]d by LMP being seen today for her first obstetrical visit.  Her obstetrical history is significant for none. Patient does intend to breast feed. Pregnancy history fully reviewed.  Patient reports no complaints.  HISTORY: OB History  Gravida Para Term Preterm AB Living  1 0 0 0 0 0  SAB TAB Ectopic Multiple Live Births  0 0 0 0 0    # Outcome Date GA Lbr Len/2nd Weight Sex Delivery Anes PTL Lv  1 Current            Last pap smear was unsure. Went to HD. Was told it was normal.   Past Medical History:  Diagnosis Date  . Medical history non-contributory    Past Surgical History:  Procedure Laterality Date  . DENTAL SURGERY    . NO PAST SURGERIES    . WISDOM TOOTH EXTRACTION     Family History  Problem Relation Age of Onset  . Healthy Mother   . Hypertension Father   . Bipolar disorder Brother    Social History   Tobacco Use  . Smoking status: Current Every Day Smoker    Packs/day: 0.25    Types: Cigarettes  . Smokeless tobacco: Never Used  Substance Use Topics  . Alcohol use: Not Currently    Comment: none since pregnancy  . Drug use: Yes    Types: Marijuana   No Known Allergies Current Outpatient Medications on File Prior to Visit  Medication Sig Dispense Refill  . Prenatal Vit-Fe Fumarate-FA (PRENATAL COMPLETE) 14-0.4 MG TABS Take 1 tablet by mouth daily. 30 each 0  . Doxylamine-Pyridoxine 10-10 MG TBEC Take 1 tablet by mouth 3 (three) times daily as needed (nausea, vomiting). (Patient not taking: Reported on 11/06/2018) 30 tablet 0   No current facility-administered medications on file prior to visit.      Exam   Vitals:   11/20/18 1030  BP: 112/77  Pulse: 81  Weight: 213 lb 11.2 oz (96.9 kg)   Fetal Heart Rate (bpm): 153  Uterus:   appropriate for GA  Pelvic Exam: Perineum: no hemorrhoids, normal perineum   Vulva: normal external genitalia, no lesions   Vagina:  normal mucosa, normal  discharge   Cervix: no lesions and normal, pap smear done.    Adnexa: normal adnexa and no mass, fullness, tenderness   Bony Pelvis: average  System: General: well-developed, well-nourished female in no acute distress   Skin: normal coloration and turgor, no rashes   Neurologic: oriented, normal, negative, normal mood   Extremities: normal strength, tone, and muscle mass, ROM of all joints is normal   HEENT PERRL, extraocular movement intact and sclera clear, anicteric   Mouth/Teeth mucous membranes moist, pharynx normal without lesions and dental hygiene good   Neck supple and no masses   Cardiovascular: regular rate and rhythm   Respiratory:  no respiratory distress, normal breath sounds   Abdomen: soft, non-tender; bowel sounds normal; no masses,  no organomegaly     Assessment:   Pregnancy: G1P0 Patient Active Problem List   Diagnosis Date Noted  . Supervision of low-risk first pregnancy 11/06/2018     Plan:  1. Encounter for supervision of low-risk first pregnancy in second trimester -- Initial labs drawn. -- Continue prenatal vitamins. -- Genetic Screening discussed, NIPS: ordered. -- Ultrasound discussed; fetal anatomic survey: already scheduled. -- Problem list reviewed and updated. -- The nature of [facility]  Faculty Practice with multiple MDs and other Advanced Practice Providers was explained to patient; also emphasized that residents, students are part of our team. -- Routine obstetric precautions reviewed.  Follow-Up: 4 weeks

## 2018-11-20 NOTE — Patient Instructions (Signed)

## 2018-11-23 LAB — CYTOLOGY - PAP: Diagnosis: NEGATIVE

## 2018-11-23 NOTE — Progress Notes (Signed)
Medicaid Home Form Completed-11-23-18 

## 2018-11-24 ENCOUNTER — Encounter: Payer: Self-pay | Admitting: *Deleted

## 2018-11-25 ENCOUNTER — Other Ambulatory Visit: Payer: Self-pay | Admitting: Family Medicine

## 2018-11-25 DIAGNOSIS — R7303 Prediabetes: Secondary | ICD-10-CM

## 2018-11-25 DIAGNOSIS — Z6841 Body Mass Index (BMI) 40.0 and over, adult: Secondary | ICD-10-CM

## 2018-12-07 ENCOUNTER — Other Ambulatory Visit: Payer: Self-pay

## 2018-12-11 ENCOUNTER — Ambulatory Visit (HOSPITAL_COMMUNITY)
Admission: RE | Admit: 2018-12-11 | Discharge: 2018-12-11 | Disposition: A | Discharge status: Home / Self Care | Payer: Medicaid Other | Source: Ambulatory Visit | Attending: Family Medicine | Admitting: Family Medicine

## 2018-12-11 DIAGNOSIS — Z3400 Encounter for supervision of normal first pregnancy, unspecified trimester: Secondary | ICD-10-CM

## 2018-12-15 ENCOUNTER — Other Ambulatory Visit: Payer: Medicaid Other

## 2018-12-15 ENCOUNTER — Other Ambulatory Visit (HOSPITAL_COMMUNITY): Payer: Self-pay | Admitting: *Deleted

## 2018-12-15 ENCOUNTER — Ambulatory Visit (HOSPITAL_COMMUNITY)
Admission: RE | Admit: 2018-12-15 | Discharge: 2018-12-15 | Disposition: A | Discharge status: Home / Self Care | Payer: Medicaid Other | Source: Ambulatory Visit | Attending: Obstetrics and Gynecology | Admitting: Obstetrics and Gynecology

## 2018-12-15 ENCOUNTER — Other Ambulatory Visit: Payer: Self-pay | Admitting: Family Medicine

## 2018-12-15 DIAGNOSIS — O99332 Smoking (tobacco) complicating pregnancy, second trimester: Secondary | ICD-10-CM | POA: Diagnosis not present

## 2018-12-15 DIAGNOSIS — O99212 Obesity complicating pregnancy, second trimester: Secondary | ICD-10-CM

## 2018-12-15 DIAGNOSIS — O99322 Drug use complicating pregnancy, second trimester: Secondary | ICD-10-CM

## 2018-12-15 DIAGNOSIS — Z3687 Encounter for antenatal screening for uncertain dates: Secondary | ICD-10-CM

## 2018-12-15 DIAGNOSIS — Z3A18 18 weeks gestation of pregnancy: Secondary | ICD-10-CM

## 2018-12-15 DIAGNOSIS — Z3400 Encounter for supervision of normal first pregnancy, unspecified trimester: Secondary | ICD-10-CM

## 2018-12-15 DIAGNOSIS — R7303 Prediabetes: Secondary | ICD-10-CM | POA: Diagnosis not present

## 2018-12-15 DIAGNOSIS — Z362 Encounter for other antenatal screening follow-up: Secondary | ICD-10-CM

## 2018-12-15 DIAGNOSIS — Z363 Encounter for antenatal screening for malformations: Secondary | ICD-10-CM | POA: Diagnosis not present

## 2018-12-16 LAB — GLUCOSE TOLERANCE, 2 HOURS W/ 1HR
Glucose, 1 hour: 141 mg/dL (ref 65–179)
Glucose, 2 hour: 125 mg/dL (ref 65–152)
Glucose, Fasting: 87 mg/dL (ref 65–91)

## 2018-12-18 ENCOUNTER — Encounter: Payer: Self-pay | Admitting: Advanced Practice Midwife

## 2018-12-18 NOTE — Progress Notes (Deleted)
   PRENATAL VISIT NOTE  Subjective:  Valerie Velasquez is a 28 y.o. G1P0 at [redacted]w[redacted]d being seen today for ongoing prenatal care.  She is currently monitored for the following issues for this low-risk pregnancy and has Supervision of low-risk first pregnancy; Prediabetes; and Morbid obesity with BMI of 40.0-44.9, adult (HCC) on their problem list.  Patient reports {sx:14538}.   .  .   . Denies leaking of fluid.   The following portions of the patient's history were reviewed and updated as appropriate: allergies, current medications, past family history, past medical history, past social history, past surgical history and problem list.   Objective:  There were no vitals filed for this visit.  Fetal Status:           General:  Alert, oriented and cooperative. Patient is in no acute distress.  Skin: Skin is warm and dry. No rash noted.   Cardiovascular: Normal heart rate noted  Respiratory: Normal respiratory effort, no problems with respiration noted  Abdomen: Soft, gravid, appropriate for gestational age.        Pelvic: {Blank single:19197::"Cervical exam performed","Cervical exam deferred"}        Extremities: Normal range of motion.     Mental Status: Normal mood and affect. Normal behavior. Normal judgment and thought content.   Assessment and Plan:  Pregnancy: G1P0 at [redacted]w[redacted]d 1. Encounter for supervision of low-risk first pregnancy in second trimester -  Fu incomplete anatomy US 4/8 - Enroll patient in Babyscripts Program  2. Prediabetes - Early GTT Nml  3. Morbid obesity with BMI of 40.0-44.9, adult (HCC) - Limit wt gain  Preterm labor symptoms and general obstetric precautions including but not limited to vaginal bleeding, contractions, leaking of fluid and fetal movement were reviewed in detail with the patient. Please refer to After Visit Summary for other counseling recommendations.   No follow-ups on file.  Future Appointments  Date Time Provider Department Center   12/18/2018 10:15 AM [facility] Appl [facility]  01/13/2019  7:50 AM [facility] NURSE [facility]  01/13/2019  8:00 AM [facility] [place] 3 [facility]    Dorathy Kinsman, [place]

## 2018-12-22 ENCOUNTER — Encounter: Payer: Self-pay | Admitting: *Deleted

## 2018-12-29 ENCOUNTER — Encounter: Payer: Self-pay | Admitting: *Deleted

## 2019-01-13 ENCOUNTER — Ambulatory Visit (HOSPITAL_COMMUNITY): Payer: Self-pay

## 2019-01-13 ENCOUNTER — Encounter (HOSPITAL_COMMUNITY): Payer: Self-pay

## 2019-01-13 ENCOUNTER — Ambulatory Visit (HOSPITAL_COMMUNITY): Payer: Medicaid Other

## 2019-02-23 ENCOUNTER — Encounter: Payer: Medicaid Other | Admitting: Student

## 2019-02-23 ENCOUNTER — Other Ambulatory Visit: Payer: Medicaid Other

## 2019-08-10 ENCOUNTER — Encounter (HOSPITAL_COMMUNITY): Payer: Self-pay

## 2020-02-27 DIAGNOSIS — Z20822 Contact with and (suspected) exposure to covid-19: Secondary | ICD-10-CM | POA: Diagnosis not present

## 2020-02-27 DIAGNOSIS — J039 Acute tonsillitis, unspecified: Secondary | ICD-10-CM | POA: Diagnosis not present

## 2020-09-13 IMAGING — US US MFM OB DETAIL +14 WK
1 series · 13 of 28 positions shown · non-contrast
Comparison: none

[Series 1: us mfm ob detail +14 wk · 13 of 54 slices shown]
[im 2/54]
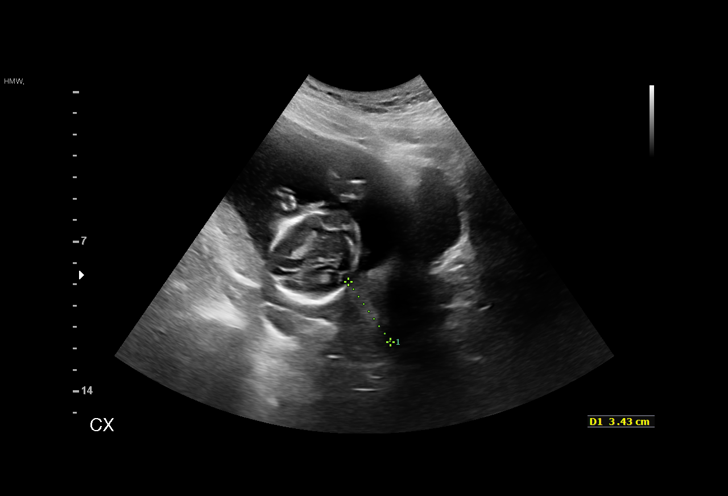
[im 6/54]
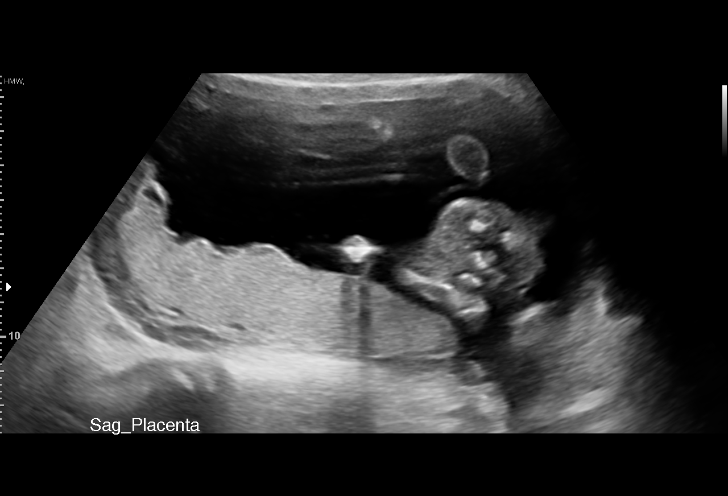
[im 10/54]
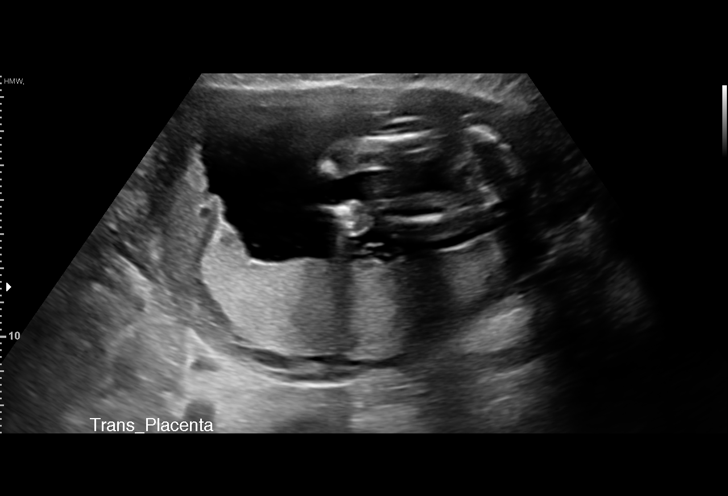
[im 14/54]
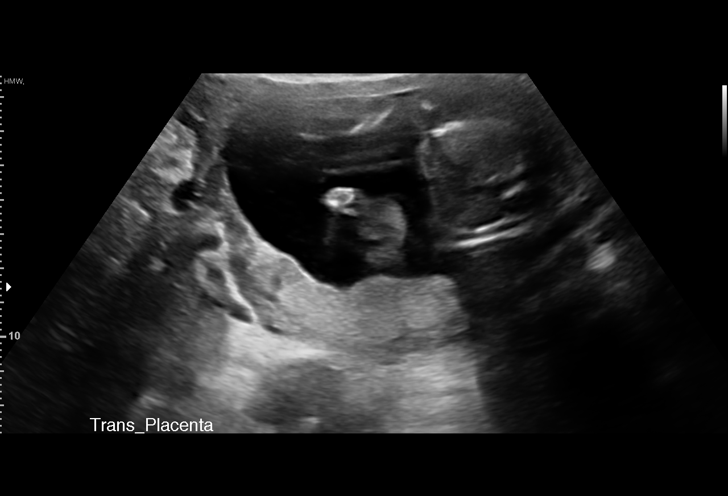
[im 18/54]
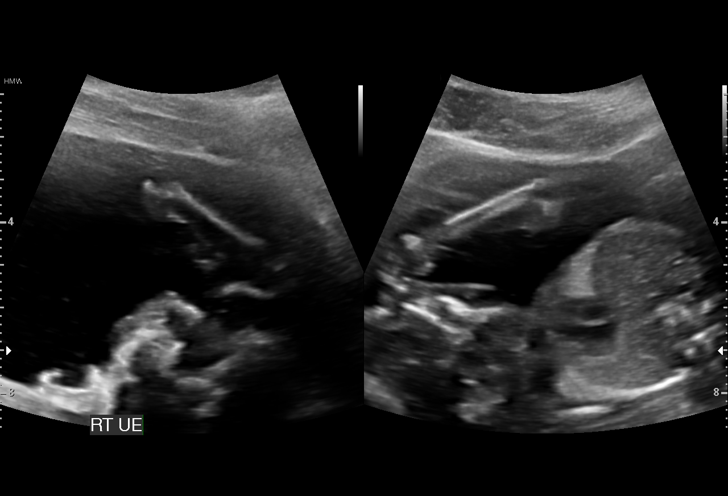
[im 22/54]
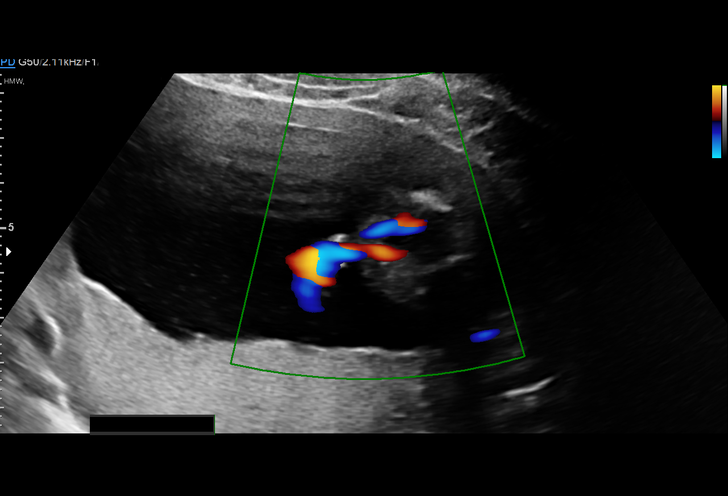
[im 28/54]
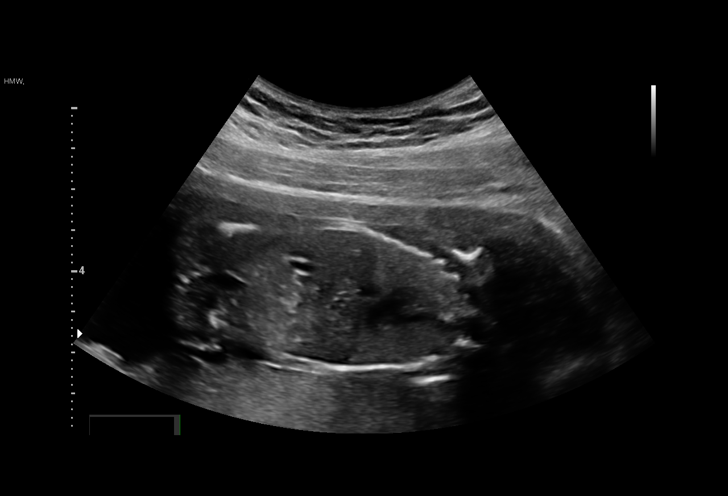
[im 32/54]
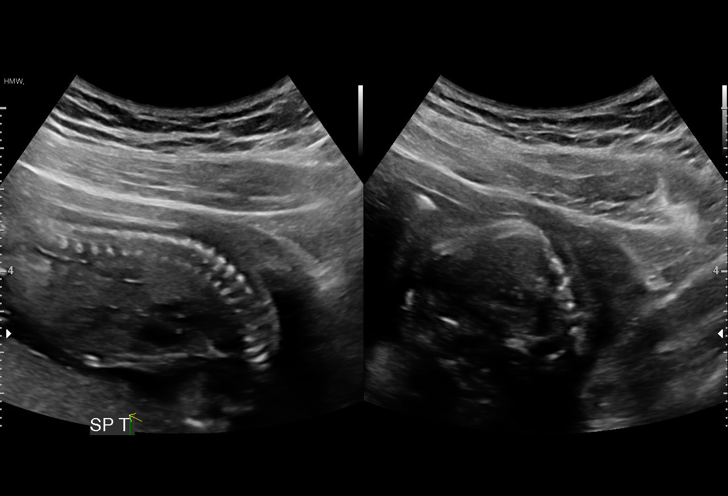
[im 36/54]
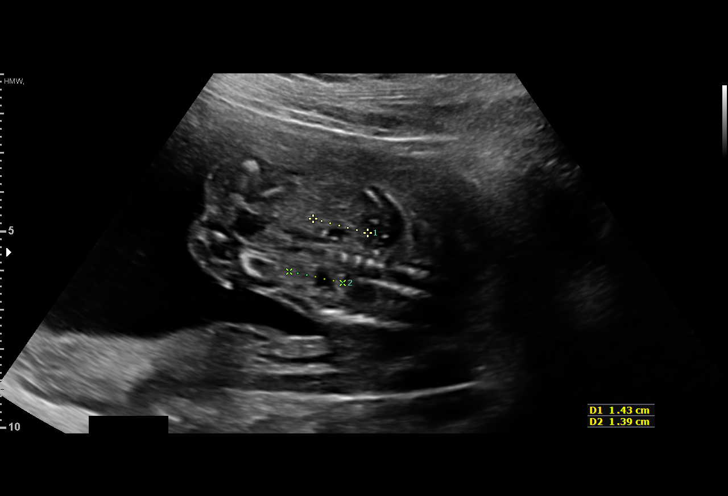
[im 40/54]
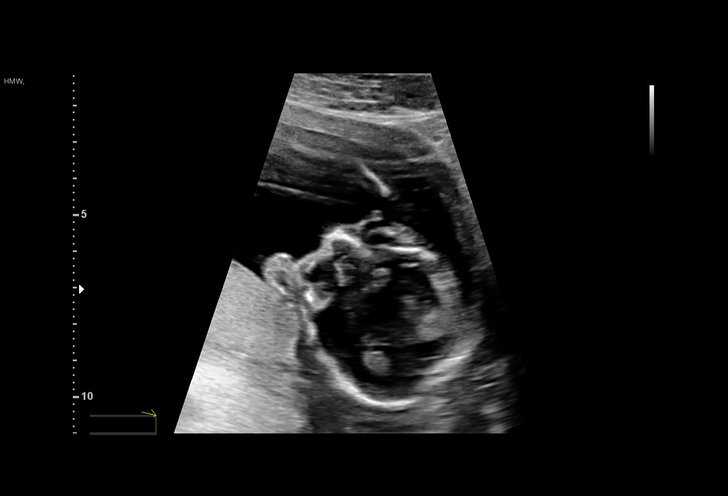
[im 44/54]
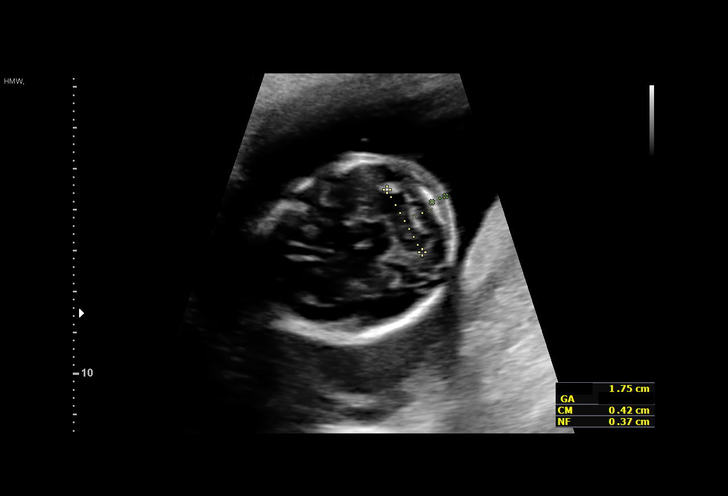
[im 48/54]
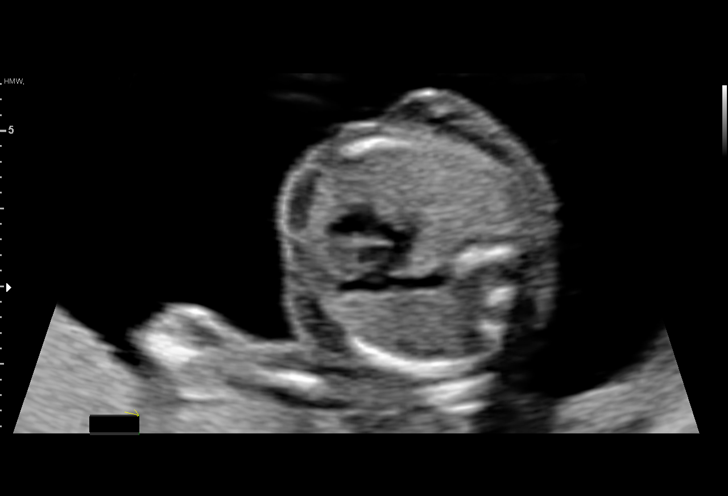
[im 52/54]
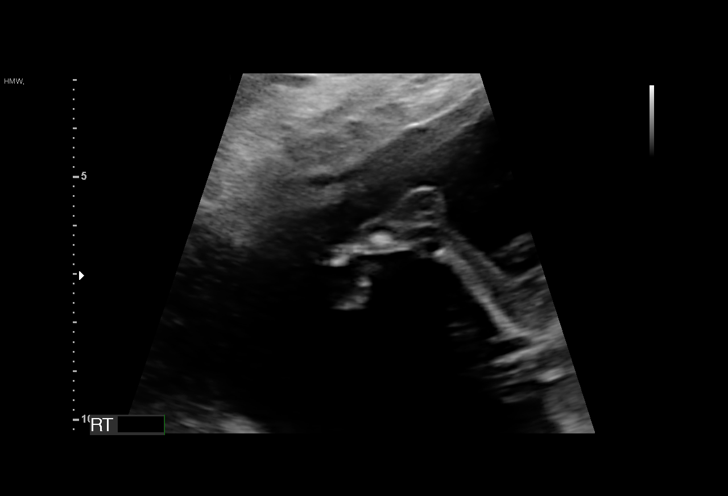

[13 of 28 positions shown; findings below may reference images not displayed]

OB/Gyn Clinic

 ----------------------------------------------------------------------

 ----------------------------------------------------------------------
Indications

  Encounter for antenatal screening for
  malformations (no testing in chart)
  Obesity complicating pregnancy, second
  trimester
  Smoking complicating pregnancy, second
  trimester
  Drug use complicating pregnancy, second
  trimester (marijuana)
  Encounter for uncertain dates
  18 weeks gestation of pregnancy
 ----------------------------------------------------------------------
Fetal Evaluation

 Num Of Fetuses:          1
 Fetal Heart Rate(bpm):   145
 Cardiac Activity:        Observed
 Presentation:            Cephalic
 Placenta:                Posterior
 P. Cord Insertion:       Visualized, central

 Amniotic Fluid
 AFI FV:      Within normal limits

                             Largest Pocket(cm)

Biometry
 BPD:      40.2  mm     G. Age:  18w 1d         60  %    CI:        77.17   %    70 - 86
                                                         FL/HC:       17.9  %    15.8 - 18
 HC:      144.9  mm     G. Age:  17w 5d         27  %    HC/AC:       1.17       1.07 -
 AC:      123.8  mm     G. Age:  18w 0d         48  %    FL/BPD:      64.4  %
 FL:       25.9  mm     G. Age:  17w 6d         40  %    FL/AC:       20.9  %    20 - 24
 HUM:      24.6  mm     G. Age:  17w 5d         46  %
 CER:      17.5  mm     G. Age:  17w 4d         39  %
 NFT:       3.7  mm

 CM:        4.2  mm

 Est. FW:     216   gm     0 lb 8 oz     48  %
OB History

 Gravidity:    1         Term:   0        Prem:   0        SAB:   0
 TOP:          0       Ectopic:  0        Living: 0
Gestational Age

 LMP:           19w 4d        Date:  07/31/18                 EDD:   05/07/19
 U/S Today:     18w 0d                                        EDD:   05/18/19
 Best:          18w 0d     Det. By:  U/S (12/15/18)           EDD:   05/18/19
Anatomy

 Cranium:               Appears normal         Aortic Arch:            Not well visualized
 Cavum:                 Not well visualized    Ductal Arch:            Not well visualized
 Ventricles:            Appears normal         Diaphragm:              Appears normal
 Choroid Plexus:        Appears normal         Stomach:                Appears normal, left
                                                                       sided
 Cerebellum:            Appears normal         Abdomen:                Appears normal
 Posterior Fossa:       Appears normal         Abdominal Wall:         Appears nml (cord
                                                                       insert, abd wall)
 Nuchal Fold:           Appears normal         Cord Vessels:           Appears normal (3
                                                                       vessel cord)
 Face:                  Orbits nl; profile not Kidneys:                Appear normal
                        well visualized
 Lips:                  Not well visualized    Bladder:                Appears normal
 Thoracic:              Appears normal         Spine:                  Appears normal
 Heart:                 Not well visualized    Upper Extremities:      Appears normal
 RVOT:                  Appears normal         Lower Extremities:      Appears normal
 LVOT:                  Not well visualized

 Other:  Fetus appears to be female. Heels visualized. Technically difficult due
         to fetal position.
Cervix Uterus Adnexa

 Cervix
 Length:            3.4  cm.
 Normal appearance by transabdominal scan.

 Uterus
 No abnormality visualized.

 Left Ovary
 No adnexal mass visualized.
 Right Ovary
 No adnexal mass visualized.

 Cul De Sac
 No free fluid seen.

 Adnexa
 No abnormality visualized.
Impression

 Normal interval growth.  No ultrasonic evidence of structural
 fetal anomalies.
 Suboptimal views of the fetal anatomy secondary to matenral
 habitus and fetal position.
Recommendations

 Follow up growth in 4 weeks.

## 2020-10-12 ENCOUNTER — Other Ambulatory Visit: Payer: Self-pay

## 2020-10-12 ENCOUNTER — Encounter (HOSPITAL_COMMUNITY): Payer: Self-pay

## 2020-10-12 ENCOUNTER — Ambulatory Visit (HOSPITAL_COMMUNITY)
Admission: EM | Admit: 2020-10-12 | Discharge: 2020-10-12 | Disposition: A | Discharge status: Home / Self Care | Payer: Medicaid Other | Attending: Student | Admitting: Student

## 2020-10-12 DIAGNOSIS — J069 Acute upper respiratory infection, unspecified: Secondary | ICD-10-CM | POA: Diagnosis not present

## 2020-10-12 DIAGNOSIS — Z20822 Contact with and (suspected) exposure to covid-19: Secondary | ICD-10-CM | POA: Diagnosis not present

## 2020-10-12 LAB — RESP PANEL BY RT-PCR (FLU A&B, COVID) ARPGX2
Influenza A by PCR: NEGATIVE
Influenza B by PCR: NEGATIVE
SARS Coronavirus 2 by RT PCR: POSITIVE — AB

## 2020-10-12 NOTE — Discharge Instructions (Addendum)
-  For fevers/chills, body aches, headaches- use Tylenol and Ibuprofen. You can alternate these for maximum effect. Use up to 3000mg Tylenol daily and 3200mg Ibuprofen daily. Make sure to take ibuprofen with food. Check the bottle of ibuprofen/tylenol for specific dosage instructions.  We are currently awaiting result of your PCR covid-19 test. This typically comes back in 1-2 days. We'll call you if the result is positive. Otherwise, the result will be sent electronically to your MyChart. You can also call this clinic and ask for your result via telephone.   Please isolate at home while awaiting these results. If your test is positive for Covid-19, continue to isolate at home for 5 days if you have mild symptoms, or a total of 10 days from symptom onset if you have more severe symptoms. If you quarantine for a shorter period of time (i.e. 5 days), make sure to wear a mask until day 10 of symptoms. Treat your symptoms at home with OTC remedies like tylenol/ibuprofen, mucinex, nyquil, etc. Seek medical attention if you develop high fevers, chest pain, shortness of breath, ear pain, facial pain, etc. Make sure to get up and move around every 2-3 hours while convalescing to help prevent blood clots. Drink plenty of fluids, and rest as much as possible.  

## 2020-10-12 NOTE — ED Triage Notes (Signed)
Pt is here with bodyaches, conegstion and cough that started 4 days ago, pt has taken Tylenol to relieve discomfort.

## 2020-10-12 NOTE — ED Provider Notes (Signed)
[facility]    CSN: [id number] Arrival date & time: 10/12/20  7169      History   Chief Complaint Chief Complaint  Patient presents with  . Generalized Body Aches  . Nasal Congestion  . Cough    HPI Valerie Velasquez is a 30 y.o. female Presenting for URI symptoms for 4 days following exposure to covid19. History of prediabetes, obesity. Endorsing bodyaches, congestion, cough. Tylenol provides some relief.  Denies fevers/chills, n/v/d, shortness of breath, chest pain,facial pain, teeth pain, headaches, sore throat, loss of taste/smell, swollen lymph nodes, ear pain.  Denies chest pain, shortness of breath, confusion, high fevers.  Not vaccinated for covid-19.   HPI  Past Medical History:  Diagnosis Date  . Medical history non-contributory     Patient Active Problem List   Diagnosis Date Noted  . Prediabetes 11/25/2018  . Morbid obesity with BMI of 40.0-44.9, adult (HCC) 11/25/2018  . Supervision of low-risk first pregnancy 11/06/2018    Past Surgical History:  Procedure Laterality Date  . DENTAL SURGERY    . NO PAST SURGERIES    . WISDOM TOOTH EXTRACTION      OB History    Gravida  1   Para      Term      Preterm      AB      Living        SAB      IAB      Ectopic      Multiple      Live Births               Home Medications    Prior to Admission medications   Medication Sig Start Date End Date Taking? Authorizing Provider  Prenatal Vit-Fe Fumarate-FA (PRENATAL COMPLETE) 14-0.4 MG TABS Take 1 tablet by mouth daily. 09/11/18   Clayborne Artist, PA-C    Family History Family History  Problem Relation Age of Onset  . Healthy Mother   . Hypertension Father   . Bipolar disorder Brother     Social History Social History   Tobacco Use  . Smoking status: Current Every Day Smoker    Packs/day: 0.25    Types: Cigarettes  . Smokeless tobacco: Never Used  Vaping Use  . Vaping Use: Never used  Substance Use Topics   . Alcohol use: Yes  . Drug use: Yes    Types: Marijuana     Allergies   Onion   Review of Systems Review of Systems  Constitutional: Negative for appetite change, chills and fever.  HENT: Positive for congestion. Negative for ear pain, rhinorrhea, sinus pressure, sinus pain and sore throat.   Eyes: Negative for redness and visual disturbance.  Respiratory: Positive for cough. Negative for chest tightness, shortness of breath and wheezing.   Cardiovascular: Negative for chest pain and palpitations.  Gastrointestinal: Negative for abdominal pain, constipation, diarrhea, nausea and vomiting.  Genitourinary: Negative for dysuria, frequency and urgency.  Musculoskeletal: Negative for myalgias.  Neurological: Negative for dizziness, weakness and headaches.  Psychiatric/Behavioral: Negative for confusion.  All other systems reviewed and are negative.    Physical Exam Triage Vital Signs ED Triage Vitals  Enc Vitals Group     BP 10/12/20 1035 (!) 127/93     Pulse Rate 10/12/20 1035 71     Resp 10/12/20 1035 19     Temp 10/12/20 1035 98 F (36.7 C)     Temp Source 10/12/20 1035 Oral  SpO2 10/12/20 1035 100 %     Weight --      Height --      Head Circumference --      Peak Flow --      Pain Score 10/12/20 1032 0     Pain Loc --      Pain Edu? --      Excl. in [place]? --    No data found.  Updated Vital Signs BP (!) 127/93 (BP Location: Right Arm)   Pulse 71   Temp 98 F (36.7 C) (Oral)   Resp 19   LMP 10/06/2020   SpO2 100%   Breastfeeding No   Visual Acuity Right Eye Distance:   Left Eye Distance:   Bilateral Distance:    Right Eye Near:   Left Eye Near:    Bilateral Near:     Physical Exam Vitals reviewed.  Constitutional:      General: She is not in acute distress.    Appearance: Normal appearance. She is not ill-appearing.  HENT:     Head: Normocephalic and atraumatic.     Right Ear: Hearing, tympanic membrane, ear canal and external ear normal. No  swelling or tenderness. There is no impacted cerumen. No mastoid tenderness. Tympanic membrane is not perforated, erythematous, retracted or bulging.     Left Ear: Hearing, tympanic membrane, ear canal and external ear normal. No swelling or tenderness. There is no impacted cerumen. No mastoid tenderness. Tympanic membrane is not perforated, erythematous, retracted or bulging.     Nose:     Right Sinus: No maxillary sinus tenderness or frontal sinus tenderness.     Left Sinus: No maxillary sinus tenderness or frontal sinus tenderness.     Mouth/Throat:     Mouth: Mucous membranes are moist.     Pharynx: Uvula midline. Posterior oropharyngeal erythema present. No oropharyngeal exudate.     Tonsils: No tonsillar exudate.  Cardiovascular:     Rate and Rhythm: Normal rate and regular rhythm.     Heart sounds: Normal heart sounds.  Pulmonary:     Breath sounds: Normal breath sounds and air entry. No wheezing, rhonchi or rales.  Chest:     Chest wall: No tenderness.  Abdominal:     General: Abdomen is flat. Bowel sounds are normal.     Tenderness: There is no abdominal tenderness. There is no guarding or rebound.  Lymphadenopathy:     Cervical: No cervical adenopathy.  Neurological:     General: No focal deficit present.     Mental Status: She is alert and oriented to person, place, and time.  Psychiatric:        Attention and Perception: Attention and perception normal.        Mood and Affect: Mood and affect normal.        Behavior: Behavior normal. Behavior is cooperative.        Thought Content: Thought content normal.        Judgment: Judgment normal.      UC Treatments / Results  Labs (all labs ordered are listed, but only abnormal results are displayed) Labs Reviewed  RESP PANEL BY RT-PCR (FLU A&B, COVID) [id number]    EKG   Radiology No results found.  Procedures Procedures (including critical care time)  Medications Ordered in UC Medications - No data to  display  Initial Impression / Assessment and Plan / UC Course  I have reviewed the triage vital signs and the nursing notes.  Pertinent labs &  imaging results that were available during my care of the patient were reviewed by me and considered in my medical decision making (see chart for details).     Covid and influenza tests sent today. Patient is not vaccinated for covid-19. Isolation precautions per CDC guidelines until negative result. Symptomatic relief with OTC Mucinex, Nyquil, etc. Return precautions- new/worsening fevers/chills, shortness of breath, chest pain, abd pain, etc.   centor score 0, rapid strep deferred.   Final Clinical Impressions(s) / UC Diagnoses   Final diagnoses:  Viral upper respiratory tract infection  Exposure to COVID-19 virus     Discharge Instructions     -For fevers/chills, body aches, headaches- use Tylenol and Ibuprofen. You can alternate these for maximum effect. Use up to 3000mg  Tylenol daily and 3200mg  Ibuprofen daily. Make sure to take ibuprofen with food. Check the bottle of ibuprofen/tylenol for specific dosage instructions.  We are currently awaiting result of your PCR covid-19 test. This typically comes back in 1-2 days. We'll call you if the result is positive. Otherwise, the result will be sent electronically to your MyChart. You can also call this clinic and ask for your result via telephone.   Please isolate at home while awaiting these results. If your test is positive for Covid-19, continue to isolate at home for 5 days if you have mild symptoms, or a total of 10 days from symptom onset if you have more severe symptoms. If you quarantine for a shorter period of time (i.e. 5 days), make sure to wear a mask until day 10 of symptoms. Treat your symptoms at home with OTC remedies like tylenol/ibuprofen, mucinex, nyquil, etc. Seek medical attention if you develop high fevers, chest pain, shortness of breath, ear pain, facial pain, etc. Make sure  to get up and move around every 2-3 hours while convalescing to help prevent blood clots. Drink plenty of fluids, and rest as much as possible.     ED Prescriptions    None     PDMP not reviewed this encounter.   , PA-C 10/12/20 1129

## 2024-01-30 DIAGNOSIS — R110 Nausea: Secondary | ICD-10-CM | POA: Diagnosis not present

## 2024-01-30 DIAGNOSIS — R519 Headache, unspecified: Secondary | ICD-10-CM | POA: Diagnosis not present

## 2024-01-30 DIAGNOSIS — R030 Elevated blood-pressure reading, without diagnosis of hypertension: Secondary | ICD-10-CM | POA: Diagnosis not present
